# Patient Record
Sex: Male | Born: 1990 | Race: Black or African American | Hispanic: No | Marital: Single | Smoking: Former smoker
Health system: Southern US, Community
[De-identification: ages and names within clinical notes are randomized; demographics above are authoritative.]

## PROBLEM LIST (undated history)

## (undated) DIAGNOSIS — J45909 Unspecified asthma, uncomplicated: Secondary | ICD-10-CM

## (undated) HISTORY — DX: Unspecified asthma, uncomplicated: J45.909

---

## 2014-04-24 ENCOUNTER — Ambulatory Visit (INDEPENDENT_AMBULATORY_CARE_PROVIDER_SITE_OTHER): Payer: Commercial Managed Care - PPO | Admitting: Family Medicine

## 2014-04-24 VITALS — BP 122/70 | HR 97 | Temp 98.2°F | Resp 16 | Ht 74.5 in | Wt 271.8 lb

## 2014-04-24 DIAGNOSIS — J0101 Acute recurrent maxillary sinusitis: Secondary | ICD-10-CM

## 2014-04-24 DIAGNOSIS — J45909 Unspecified asthma, uncomplicated: Secondary | ICD-10-CM

## 2014-04-24 DIAGNOSIS — J4520 Mild intermittent asthma, uncomplicated: Secondary | ICD-10-CM

## 2014-04-24 DIAGNOSIS — J0100 Acute maxillary sinusitis, unspecified: Secondary | ICD-10-CM

## 2014-04-24 MED ORDER — AMOXICILLIN 875 MG PO TABS: 875.0000 mg | ORAL_TABLET | Freq: Two times a day (BID) | ORAL | Status: DC

## 2014-04-24 MED ORDER — PREDNISONE 20 MG PO TABS: 40.0000 mg | ORAL_TABLET | Freq: Every day | ORAL | Status: DC

## 2014-04-24 NOTE — Progress Notes (Signed)
    Patient ID: Andre [name] MRN: [id number], DOB: [birth year], 23 y.o. Date of Encounter: 04/24/2014, 5:19 PM  Primary Physician: No PCP Per Patient  Chief Complaint:  Chief Complaint  Patient presents with  . Cough    x 3 days    HPI: 23 y.o. year old male presents with 5 day history of nasal congestion, post nasal drip, sore throat, sinus pressure, and cough. Afebrile. No chills. Nasal congestion thick and green/yellow. Sinus pressure is the worst symptom. Cough is productive secondary to post nasal drip and not associated with time of day. Ears feel full, leading to sensation of muffled hearing. Has tried OTC cold preps without success. No GI complaints.   No recent antibiotics, recent travels, vomiting, or sick contacts   No leg trauma, sedentary periods, h/o cancer, or tobacco use.  Past Medical History  Diagnosis Date  . Asthma      Home Meds: Prior to Admission medications   Not on File    Allergies:  Allergies  Allergen Reactions  . Ibuprofen Hives    History   Social History  . Marital Status: Single    Spouse Name: N/A    Number of Children: N/A  . Years of Education: N/A   Occupational History  . Not on file.   Social History Main Topics  . Smoking status: Never Smoker   . Smokeless tobacco: Not on file  . Alcohol Use: No  . Drug Use: No  . Sexual Activity: Not on file   Other Topics Concern  . Not on file   Social History Narrative  . No narrative on file     Review of Systems: Constitutional: negative for chills, fever, night sweats or weight changes Cardiovascular: negative for chest pain or palpitations Respiratory: negative for hemoptysis, wheezing, or shortness of breath Abdominal: negative for abdominal pain, nausea, vomiting or diarrhea Dermatological: negative for rash Neurologic: negative for headache   Physical Exam: Blood pressure 122/70, pulse 97, temperature 98.2 F (36.8 C), temperature source Oral, resp. rate 16,  height 6' 2.5" (1.892 m), weight 271 lb 12.8 oz (123.288 kg), SpO2 97.00%., Body mass index is 34.44 kg/(m^2). General: Well developed, well nourished, in no acute distress. Head: Normocephalic, atraumatic, eyes without discharge, sclera non-icteric, nares are congested. Bilateral auditory canals clear, TM's are without perforation, pearly grey with reflective cone of light bilaterally. Serous effusion bilaterally behind TM's. Maxillary sinus TTP. Oral cavity moist, dentition normal. Posterior pharynx with post nasal drip and mild erythema. No peritonsillar abscess or tonsillar exudate. Neck: Supple. No thyromegaly. Full ROM. No lymphadenopathy. Lungs: Clear bilaterally to auscultation without wheezes, rales, or rhonchi. Breathing is unlabored.  Heart: RRR with S1 S2. No murmurs, rubs, or gallops appreciated. Msk:  Strength and tone normal for age. Extremities: No clubbing or cyanosis. No edema. Neuro: Alert and oriented X 3. Moves all extremities spontaneously. CNII-XII grossly in tact. Psych:  Responds to questions appropriately with a normal affect.     ASSESSMENT AND PLAN:  23 y.o. year old male with sinusitis -Recurrent maxillary sinusitis, unspecified chronicity - Plan: amoxicillin (AMOXIL) 875 MG tablet  Asthmatic bronchitis, mild intermittent, uncomplicated - Plan: amoxicillin (AMOXIL) 875 MG tablet, predniSONE (DELTASONE) 20 MG tablet  -Rest/fluids -RTC precautions -RTC 3-5 days if no improvement  Signed, Elvina Sidle, MD 04/24/2014 5:19 PM

## 2014-04-24 NOTE — Patient Instructions (Signed)

## 2015-01-23 ENCOUNTER — Ambulatory Visit (INDEPENDENT_AMBULATORY_CARE_PROVIDER_SITE_OTHER): Payer: 59 | Admitting: Family Medicine

## 2015-01-23 VITALS — BP 128/80 | HR 80 | Temp 98.6°F | Resp 18 | Ht 75.5 in | Wt 263.0 lb

## 2015-01-23 DIAGNOSIS — R21 Rash and other nonspecific skin eruption: Secondary | ICD-10-CM | POA: Diagnosis not present

## 2015-01-23 LAB — POCT CBC
Granulocyte percent: 60.8 %G (ref 37–80)
HCT, POC: 47.1 % (ref 43.5–53.7)
Hemoglobin: 15.3 g/dL (ref 14.1–18.1)
Lymph, poc: 3.3 (ref 0.6–3.4)
MCH, POC: 25.8 pg — AB (ref 27–31.2)
MCHC: 32.5 g/dL (ref 31.8–35.4)
MCV: 79.4 fL — AB (ref 80–97)
MID (CBC): 0.8 (ref 0–0.9)
MPV: 10.8 fL (ref 0–99.8)
PLATELET COUNT, POC: 198 10*3/uL (ref 142–424)
POC Granulocyte: 6.4 (ref 2–6.9)
POC LYMPH PERCENT: 31.6 %L (ref 10–50)
POC MID %: 7.6 %M (ref 0–12)
RBC: 5.93 M/uL (ref 4.69–6.13)
RDW, POC: 15 %
WBC: 10.5 10*3/uL — AB (ref 4.6–10.2)

## 2015-01-23 NOTE — Progress Notes (Signed)
Subjective:    Patient ID: Andre [name], male    DOB: [birth year], 24 y.o.   MRN: [id number]  HPI Patient presents today with bumps on his hands and bump on his tongue for 3 days.  He has 2 daughters (6 months, 1 yo). Older daughter had hand foot mouth disease a couple of weeks ago.  His live in girlfriend of 4 years has had similar lesions. His hands felt a little sore prior to the lesions appearing.  He has a lesion on his tongue. It hurt at first, but now only hurts if he eats something hard. He has been gargling with warm salt water which helps.   He has seen what he thinks are bed bugs in his home. He and his girlfriend have changed all the bedding. He sleeps in boxers and a tee shirt and has not noticed any bites on his arms or legs.   Past Medical History  Diagnosis Date  . Asthma    History reviewed. No pertinent past surgical history. History reviewed. No pertinent family history. History  Substance Use Topics  . Smoking status: Current Every Day Smoker -- 3 years  . Smokeless tobacco: Not on file  . Alcohol Use: No   Medications, allergies, past medical history, surgical history, family history, social history and problem list reviewed and updated.  Review of Systems No fever, no chills, no trunk/leg/foot lesions, no cough, no SOB, no myalgias, no cold symptoms.     Objective:   Physical Exam  Constitutional: He is oriented to person, place, and time. He appears well-developed and well-nourished.  HENT:  Head: Normocephalic and atraumatic.  Mouth/Throat: Uvula is midline. No oropharyngeal exudate, posterior oropharyngeal edema or posterior oropharyngeal erythema.    Small vesicle on right lower gum line.   Neck: Normal range of motion. Neck supple.  Cardiovascular: Normal rate and regular rhythm.   Pulmonary/Chest: Effort normal.  Musculoskeletal: Normal range of motion. He exhibits no edema.  Neurological: He is alert and oriented to person, place, and time.    Skin: Skin is warm and dry. Rash noted.  Bilateral palmar aspect of fingers with multiple red, pinpoint lesions. Few on palms. No lesions on webs, dorsum of hands, arms, trunk, legs.   Psychiatric: He has a normal mood and affect. His behavior is normal. Judgment and thought content normal.  Vitals reviewed.   BP 128/80 mmHg  Pulse 80  Temp(Src) 98.6 F (37 C) (Oral)  Resp 18  Ht 6' 3.5" (1.918 m)  Wt 263 lb (119.296 kg)  BMI 32.43 kg/m2  SpO2 98% Results for orders placed or performed in visit on 01/23/15  POCT CBC  Result Value Ref Range   WBC 10.5 (A) 4.6 - 10.2 K/uL   Lymph, poc 3.3 0.6 - 3.4   POC LYMPH PERCENT 31.6 10 - 50 %L   MID (cbc) 0.8 0 - 0.9   POC MID % 7.6 0 - 12 %M   POC Granulocyte 6.4 2 - 6.9   Granulocyte percent 60.8 37 - 80 %G   RBC 5.93 4.69 - 6.13 M/uL   Hemoglobin 15.3 14.1 - 18.1 g/dL   HCT, POC 08.6 57.8 - 53.7 %   MCV 79.4 (A) 80 - 97 fL   MCH, POC 25.8 (A) 27 - 31.2 pg   MCHC 32.5 31.8 - 35.4 g/dL   RDW, POC 46.9 %   Platelet Count, POC 198 142 - 424 K/uL   MPV 10.8 0 - 99.8 fL  Assessment & Plan:  Discussed with Dr. Katrinka Blazing who also saw the patient  1. Rash and nonspecific skin eruption - POCT CBC- unremarkable - likely viral - Provided written and verbal information regarding diagnosis and treatment. - will RTC if any worsening rash/myalgias/headaches/fever - provided information about bed bugs.    Olean Ree, FNP-BC  [facility], [facility]  01/23/2015 12:18 PM

## 2015-01-23 NOTE — Patient Instructions (Signed)
Your blood count looks good.  This is likely a virus and will resolve on its own.  If it gets worse or if you have nausea/vomiting, fever, severe headache, please come back in to see [place].

## 2015-01-24 ENCOUNTER — Encounter: Payer: Self-pay | Admitting: Family Medicine

## 2015-01-24 NOTE — Progress Notes (Signed)
History and physical examinations obtained with FNP Gessner. Agree with assessment and plan. Kristi Martin Smith, M.D. [facility]  [place] [address] [place], [place]  [address] [phone] phone [phone] fax  

## 2015-12-14 DIAGNOSIS — Z888 Allergy status to other drugs, medicaments and biological substances status: Secondary | ICD-10-CM | POA: Diagnosis not present

## 2015-12-14 DIAGNOSIS — J45901 Unspecified asthma with (acute) exacerbation: Secondary | ICD-10-CM | POA: Diagnosis not present

## 2015-12-14 DIAGNOSIS — Z720 Tobacco use: Secondary | ICD-10-CM | POA: Diagnosis not present

## 2015-12-14 DIAGNOSIS — F17210 Nicotine dependence, cigarettes, uncomplicated: Secondary | ICD-10-CM | POA: Diagnosis not present

## 2015-12-15 DIAGNOSIS — Z888 Allergy status to other drugs, medicaments and biological substances status: Secondary | ICD-10-CM | POA: Diagnosis not present

## 2015-12-15 DIAGNOSIS — F17210 Nicotine dependence, cigarettes, uncomplicated: Secondary | ICD-10-CM | POA: Diagnosis not present

## 2015-12-15 DIAGNOSIS — J45901 Unspecified asthma with (acute) exacerbation: Secondary | ICD-10-CM | POA: Diagnosis not present

## 2016-03-31 ENCOUNTER — Ambulatory Visit (INDEPENDENT_AMBULATORY_CARE_PROVIDER_SITE_OTHER): Payer: 59 | Admitting: Family Medicine

## 2016-03-31 VITALS — BP 120/74 | HR 89 | Temp 98.6°F | Resp 17 | Ht 75.0 in | Wt 254.0 lb

## 2016-03-31 DIAGNOSIS — Z8709 Personal history of other diseases of the respiratory system: Secondary | ICD-10-CM | POA: Diagnosis not present

## 2016-03-31 DIAGNOSIS — H547 Unspecified visual loss: Secondary | ICD-10-CM | POA: Diagnosis not present

## 2016-03-31 DIAGNOSIS — R8290 Unspecified abnormal findings in urine: Secondary | ICD-10-CM

## 2016-03-31 LAB — POC MICROSCOPIC URINALYSIS (UMFC): Mucus: ABSENT

## 2016-03-31 LAB — POCT URINALYSIS DIP (MANUAL ENTRY)
Bilirubin, UA: NEGATIVE
GLUCOSE UA: NEGATIVE
Ketones, POC UA: NEGATIVE
NITRITE UA: NEGATIVE
PH UA: 7.5
Protein Ur, POC: NEGATIVE
Spec Grav, UA: 1.015
UROBILINOGEN UA: 0.2

## 2016-03-31 LAB — GLUCOSE, POCT (MANUAL RESULT ENTRY): POC GLUCOSE: 119 mg/dL — AB (ref 70–99)

## 2016-03-31 NOTE — Progress Notes (Signed)
Patient ID: Andre [name], male    DOB: 07/05/1991  Age: 25 y.o. MRN: [id number]  Chief Complaint  Patient presents with  . Other    hbp, poor vision,   . Asthma    Subjective:   Patient is applying for a job. He was told to get further evaluated because of his history of asthma and because they found proteinuria and glycosuria on his urinalysis. Also he has complaints of poor vision. Has not seen an eye doctor yet. No history of diabetes. He says when he had his asthma attack back last spring he was smoking. Since then he has quit smoking entirely and has not had any problems with his breathing. He is not having history of high blood pressure, though it is noted his blood pressure was high when he came in today. He does not think it was high when he was at the other place getting his pre-job evaluation.  Current allergies, medications, problem list, past/family and social histories reviewed.  Objective:  BP 120/74 (BP Location: Left Arm, Patient Position: Sitting, Cuff Size: Large)   Pulse 89   Temp 98.6 F (37 C) (Oral)   Resp 17   Ht 6\' 3"  (1.905 m)   Wt 254 lb (115.2 kg)   SpO2 99%   PF 730 L/min   BMI 31.75 kg/m   No acute distress. Healthy appearing young man. Repeat blood pressure was good at 120/74. Chest clear. Heart regular without murmur.    Visual Acuity Screening   Right eye Left eye Both eyes  Without correction: 20/40 20/25 20/25   With correction:      Peak flow 730 with her predicted max 682 Assessment & Plan:   Assessment: 1. History of asthma   2. Abnormal urine findings   3. Poor vision       Plan: Check urinalysis. Also check blood sugar. Reviewed his vision screen.   His vision is not great, he might benefit from glasses, but it certainly will not cause problems with most jobs at this level. Toniann FailWendy see an eye doctor.    Results for orders placed or performed in visit on 03/31/16  POCT glucose (manual entry)  Result Value Ref Range   POC  Glucose 119 (A) 70 - 99 mg/dl  POCT Microscopic Urinalysis ([facility])  Result Value Ref Range   WBC,UR,HPF,POC None None WBC/hpf   RBC,UR,HPF,POC None None RBC/hpf   Bacteria Few (A) None, Too numerous to count   Mucus Absent Absent   Epithelial Cells, UR Per Microscopy Few (A) None, Too numerous to count cells/hpf  POCT urinalysis dipstick  Result Value Ref Range   Color, UA yellow yellow   Clarity, UA clear clear   Glucose, UA negative negative   Bilirubin, UA negative negative   Ketones, POC UA negative negative   Spec Grav, UA 1.015    Blood, UA trace-lysed (A) negative   pH, UA 7.5    Protein Ur, POC negative negative   Urobilinogen, UA 0.2    Nitrite, UA Negative Negative   Leukocytes, UA Trace (A) Negative    Orders Placed This Encounter  Procedures  . POCT glucose (manual entry)  . POCT Microscopic Urinalysis ([facility])  . POCT urinalysis dipstick    No orders of the defined types were placed in this encounter.   Letter written for him for his employment.     Patient Instructions   You are cleared to work.  Continue to avoid tobacco use  If you have  problems with your breathing develop please return  Recommend that you see an eye doctor sometime for routine eye examination.      IF you received an x-ray today, you will receive an invoice from [facility]. Please contact [facility] at [phone] with questions or concerns regarding your invoice.   IF you received labwork today, you will receive an invoice from [facility]. Please contact [facility] at [phone] with questions or concerns regarding your invoice.   Our billing staff will not be able to assist you with questions regarding bills from these companies.  You will be contacted with the lab results as soon as they are available. The fastest way to get your results is to activate your My Chart account. Instructions are located on the last page of this  paperwork. If you have not heard from us regarding the results in 2 weeks, please contact this office.         No Follow-up on file.   Lariah Fleer, MD 03/31/2016

## 2016-03-31 NOTE — Patient Instructions (Addendum)
You are cleared to work.  Continue to avoid tobacco use  If you have problems with your breathing develop please return  Recommend that you see an eye doctor sometime for routine eye examination.      IF you received an x-ray today, you will receive an invoice from [facility]. Please contact [facility] at [phone] with questions or concerns regarding your invoice.   IF you received labwork today, you will receive an invoice from [facility]. Please contact [facility] at [phone] with questions or concerns regarding your invoice.   Our billing staff will not be able to assist you with questions regarding bills from these companies.  You will be contacted with the lab results as soon as they are available. The fastest way to get your results is to activate your My Chart account. Instructions are located on the last page of this paperwork. If you have not heard from us regarding the results in 2 weeks, please contact this office.

## 2016-08-23 DIAGNOSIS — J069 Acute upper respiratory infection, unspecified: Secondary | ICD-10-CM | POA: Diagnosis not present

## 2016-08-23 DIAGNOSIS — Z886 Allergy status to analgesic agent status: Secondary | ICD-10-CM | POA: Diagnosis not present

## 2016-08-23 DIAGNOSIS — J45901 Unspecified asthma with (acute) exacerbation: Secondary | ICD-10-CM | POA: Diagnosis not present

## 2016-08-23 DIAGNOSIS — Z720 Tobacco use: Secondary | ICD-10-CM | POA: Diagnosis not present

## 2016-08-23 DIAGNOSIS — R0602 Shortness of breath: Secondary | ICD-10-CM | POA: Diagnosis not present

## 2016-08-23 DIAGNOSIS — B9789 Other viral agents as the cause of diseases classified elsewhere: Secondary | ICD-10-CM | POA: Diagnosis not present

## 2016-08-23 DIAGNOSIS — F17210 Nicotine dependence, cigarettes, uncomplicated: Secondary | ICD-10-CM | POA: Diagnosis not present

## 2016-08-26 DIAGNOSIS — Z7951 Long term (current) use of inhaled steroids: Secondary | ICD-10-CM | POA: Diagnosis not present

## 2016-08-26 DIAGNOSIS — Z79899 Other long term (current) drug therapy: Secondary | ICD-10-CM | POA: Diagnosis not present

## 2016-08-26 DIAGNOSIS — F17210 Nicotine dependence, cigarettes, uncomplicated: Secondary | ICD-10-CM | POA: Diagnosis not present

## 2016-08-26 DIAGNOSIS — J209 Acute bronchitis, unspecified: Secondary | ICD-10-CM | POA: Diagnosis not present

## 2016-08-26 DIAGNOSIS — Z888 Allergy status to other drugs, medicaments and biological substances status: Secondary | ICD-10-CM | POA: Diagnosis not present

## 2016-08-26 DIAGNOSIS — J45909 Unspecified asthma, uncomplicated: Secondary | ICD-10-CM | POA: Diagnosis not present

## 2016-08-26 DIAGNOSIS — Z720 Tobacco use: Secondary | ICD-10-CM | POA: Diagnosis not present

## 2016-10-27 ENCOUNTER — Emergency Department (HOSPITAL_BASED_OUTPATIENT_CLINIC_OR_DEPARTMENT_OTHER)
Admission: EM | Admit: 2016-10-27 | Discharge: 2016-10-27 | Disposition: A | Discharge status: Home / Self Care | Payer: 59 | Attending: Emergency Medicine | Admitting: Emergency Medicine

## 2016-10-27 ENCOUNTER — Encounter (HOSPITAL_BASED_OUTPATIENT_CLINIC_OR_DEPARTMENT_OTHER): Payer: Self-pay | Admitting: Respiratory Therapy

## 2016-10-27 DIAGNOSIS — Z87891 Personal history of nicotine dependence: Secondary | ICD-10-CM | POA: Diagnosis not present

## 2016-10-27 DIAGNOSIS — J45909 Unspecified asthma, uncomplicated: Secondary | ICD-10-CM | POA: Insufficient documentation

## 2016-10-27 DIAGNOSIS — R05 Cough: Secondary | ICD-10-CM | POA: Diagnosis not present

## 2016-10-27 DIAGNOSIS — J111 Influenza due to unidentified influenza virus with other respiratory manifestations: Secondary | ICD-10-CM

## 2016-10-27 DIAGNOSIS — R110 Nausea: Secondary | ICD-10-CM | POA: Diagnosis not present

## 2016-10-27 DIAGNOSIS — R69 Illness, unspecified: Secondary | ICD-10-CM

## 2016-10-27 DIAGNOSIS — R51 Headache: Secondary | ICD-10-CM | POA: Diagnosis not present

## 2016-10-27 MED ORDER — CETIRIZINE HCL 10 MG PO TABS: 10.0000 mg | ORAL_TABLET | Freq: Every day | ORAL | 0 refills | Status: AC

## 2016-10-27 MED ORDER — BENZONATATE 100 MG PO CAPS: 200.0000 mg | ORAL_CAPSULE | Freq: Two times a day (BID) | ORAL | 0 refills | Status: AC | PRN

## 2016-10-27 MED ORDER — ONDANSETRON 4 MG PO TBDP
4.0000 mg | ORAL_TABLET | Freq: Once | ORAL | Status: AC
  Administered 2016-10-27: 4 mg via ORAL
  Filled 2016-10-27: qty 1

## 2016-10-27 MED ORDER — ACETAMINOPHEN 500 MG PO TABS
1000.0000 mg | ORAL_TABLET | Freq: Once | ORAL | Status: AC
  Administered 2016-10-27: 1000 mg via ORAL
  Filled 2016-10-27: qty 2

## 2016-10-27 NOTE — ED Triage Notes (Signed)
c/o cough, HA, body aches-NAD-steady gait

## 2016-10-27 NOTE — ED Provider Notes (Signed)
[facility]-EMERGENCY DEPT Provider Note   CSN: [id number] Arrival date & time: 10/27/16 1108     History    Chief Complaint  Patient presents with  . Cough     HPI Andre [name] is a 26 y.o. male.  26yo M who p/w Cough, body aches, and headache. Yesterday, the patient began developing a cough associated with body aches and headache. He felt hot last night but did not measure his temperature. He has also had chills. He tried to go to work this morning but had to leave because he felt bad. He reports mild nausea but no vomiting, diarrhea, congestion, or sore throat. He reports that his friend who he has been spending time with has been sick recently with similar symptoms. He took 2 medications from his friend last night, cetirizine and a clear gelcap Cough medication which seemed to improve his sx. he has been able to drink well and denies any urinary symptoms. No breathing problems. No rash.   Past Medical History:  Diagnosis Date  . Asthma      There are no active problems to display for this patient.   History reviewed. No pertinent surgical history.      Home Medications    Prior to Admission medications   Not on File      No family history on file.   Social History  Substance Use Topics  . Smoking status: Former Games developer  . Smokeless tobacco: Never Used  . Alcohol use No     Allergies     Ibuprofen    Review of Systems  10 Systems reviewed and are negative for acute change except as noted in the HPI.   Physical Exam Updated Vital Signs BP 140/88 (BP Location: Left Arm)   Pulse 90   Temp 99.8 F (37.7 C) (Oral)   Resp 18   Ht 6\' 4"  (1.93 m)   Wt 257 lb (116.6 kg)   SpO2 99%   BMI 31.28 kg/m   Physical Exam  Constitutional: He is oriented to person, place, and time. He appears well-developed and well-nourished. No distress.  HENT:  Head: Normocephalic and atraumatic.  Mild erythema of posterior oropharynx w/ no tonsillar edema, uvula  midline, Moist mucous membranes  Eyes: Conjunctivae are normal. Pupils are equal, round, and reactive to light.  Neck: Normal range of motion. Neck supple.  Cardiovascular: Normal rate, regular rhythm and normal heart sounds.   No murmur heard. Pulmonary/Chest: Effort normal and breath sounds normal. He has no wheezes.  Abdominal: Soft. Bowel sounds are normal. He exhibits no distension. There is no tenderness.  Musculoskeletal: He exhibits no edema.  Lymphadenopathy:    He has no cervical adenopathy.  Neurological: He is alert and oriented to person, place, and time.  Fluent speech  Skin: Skin is warm and dry.  Psychiatric: He has a normal mood and affect. Judgment normal.  Nursing note and vitals reviewed.     ED Treatments / Results  Labs (all labs ordered are listed, but only abnormal results are displayed) Labs Reviewed - No data to display   EKG  EKG Interpretation  Date/Time:    Ventricular Rate:    PR Interval:    QRS Duration:   QT Interval:    QTC Calculation:   R Axis:     Text Interpretation:           Radiology No results found.  Procedures Procedures (including critical care time) Procedures  Medications Ordered in ED  Medications  acetaminophen (TYLENOL) tablet 1,000 mg (1,000 mg Oral Given 10/27/16 1150)  ondansetron (ZOFRAN-ODT) disintegrating tablet 4 mg (4 mg Oral Given 10/27/16 1149)     Initial Impression / Assessment and Plan / ED Course  I have reviewed the triage vital signs and the nursing notes.       Pt w/ 1 day of influenza-like symptoms including cough, body aches, headaches, and mild nausea. He was awake and alert, comfortable on exam, normal vital signs and normal O2 saturation on room air. No wheezing or respiratory complaints. Neck supple with no signs or symptoms of meningitis. His symptoms are consistent with a viral process such as influenza. Given his well appearance, overall health I explained the risks and benefits  of Tamiflu but ultimately felt that Tamiflu would not offer much benefit. He denies any respiratory complaints. Gave the patient Tylenol and Zofran and emphasized the importance of hydration. Reviewed return precautions including any worsening symptoms or breathing problems. Patient voiced understanding and was discharged in satisfactory condition.  Final Clinical Impressions(s) / ED Diagnoses   Final diagnoses:  Influenza-like illness     New Prescriptions   No medications on file       Laurence Spatesachel Morgan Dorwin Fitzhenry, MD 10/27/16 1208

## 2016-10-27 NOTE — ED Notes (Signed)
ED Provider at bedside. 

## 2017-05-27 DIAGNOSIS — Z87891 Personal history of nicotine dependence: Secondary | ICD-10-CM | POA: Diagnosis not present

## 2017-05-27 DIAGNOSIS — R1030 Lower abdominal pain, unspecified: Secondary | ICD-10-CM | POA: Diagnosis not present

## 2017-05-27 DIAGNOSIS — A6001 Herpesviral infection of penis: Secondary | ICD-10-CM | POA: Diagnosis not present

## 2017-05-27 DIAGNOSIS — Z886 Allergy status to analgesic agent status: Secondary | ICD-10-CM | POA: Diagnosis not present

## 2017-05-27 DIAGNOSIS — B009 Herpesviral infection, unspecified: Secondary | ICD-10-CM | POA: Diagnosis not present

## 2017-11-14 ENCOUNTER — Emergency Department (HOSPITAL_COMMUNITY): Payer: Managed Care, Other (non HMO)

## 2017-11-14 ENCOUNTER — Inpatient Hospital Stay (HOSPITAL_COMMUNITY): Payer: Managed Care, Other (non HMO)

## 2017-11-14 ENCOUNTER — Inpatient Hospital Stay (HOSPITAL_COMMUNITY)
Admission: EM | Admit: 2017-11-14 | Discharge: 2017-11-17 | DRG: 964 | Disposition: A | Discharge status: Home Health | Payer: Managed Care, Other (non HMO) | Attending: General Surgery | Admitting: General Surgery

## 2017-11-14 ENCOUNTER — Encounter (HOSPITAL_COMMUNITY): Payer: Self-pay | Admitting: Emergency Medicine

## 2017-11-14 ENCOUNTER — Emergency Department (HOSPITAL_COMMUNITY): Payer: Managed Care, Other (non HMO) | Admitting: Certified Registered"

## 2017-11-14 ENCOUNTER — Encounter (HOSPITAL_COMMUNITY): Admission: EM | Disposition: A | Discharge status: Home Health | Payer: Self-pay | Source: Home / Self Care

## 2017-11-14 ENCOUNTER — Other Ambulatory Visit: Payer: Self-pay

## 2017-11-14 DIAGNOSIS — Y92410 Unspecified street and highway as the place of occurrence of the external cause: Secondary | ICD-10-CM

## 2017-11-14 DIAGNOSIS — S73004A Unspecified dislocation of right hip, initial encounter: Secondary | ICD-10-CM | POA: Diagnosis present

## 2017-11-14 DIAGNOSIS — S022XXB Fracture of nasal bones, initial encounter for open fracture: Secondary | ICD-10-CM

## 2017-11-14 DIAGNOSIS — S72061A Displaced articular fracture of head of right femur, initial encounter for closed fracture: Secondary | ICD-10-CM

## 2017-11-14 DIAGNOSIS — S72051A Unspecified fracture of head of right femur, initial encounter for closed fracture: Principal | ICD-10-CM | POA: Diagnosis present

## 2017-11-14 DIAGNOSIS — S062X0A Diffuse traumatic brain injury without loss of consciousness, initial encounter: Secondary | ICD-10-CM | POA: Diagnosis present

## 2017-11-14 DIAGNOSIS — S27329A Contusion of lung, unspecified, initial encounter: Secondary | ICD-10-CM

## 2017-11-14 DIAGNOSIS — J45909 Unspecified asthma, uncomplicated: Secondary | ICD-10-CM | POA: Diagnosis present

## 2017-11-14 DIAGNOSIS — S01511A Laceration without foreign body of lip, initial encounter: Secondary | ICD-10-CM | POA: Diagnosis present

## 2017-11-14 DIAGNOSIS — F10129 Alcohol abuse with intoxication, unspecified: Secondary | ICD-10-CM | POA: Diagnosis present

## 2017-11-14 DIAGNOSIS — S27322A Contusion of lung, bilateral, initial encounter: Secondary | ICD-10-CM | POA: Diagnosis present

## 2017-11-14 DIAGNOSIS — E876 Hypokalemia: Secondary | ICD-10-CM | POA: Diagnosis present

## 2017-11-14 DIAGNOSIS — S72001A Fracture of unspecified part of neck of right femur, initial encounter for closed fracture: Secondary | ICD-10-CM

## 2017-11-14 DIAGNOSIS — Y903 Blood alcohol level of 60-79 mg/100 ml: Secondary | ICD-10-CM | POA: Diagnosis present

## 2017-11-14 DIAGNOSIS — S06350A Traumatic hemorrhage of left cerebrum without loss of consciousness, initial encounter: Secondary | ICD-10-CM

## 2017-11-14 DIAGNOSIS — S060X0A Concussion without loss of consciousness, initial encounter: Secondary | ICD-10-CM | POA: Diagnosis present

## 2017-11-14 DIAGNOSIS — Z6836 Body mass index (BMI) 36.0-36.9, adult: Secondary | ICD-10-CM | POA: Diagnosis not present

## 2017-11-14 DIAGNOSIS — S0990XA Unspecified injury of head, initial encounter: Secondary | ICD-10-CM

## 2017-11-14 DIAGNOSIS — S0121XA Laceration without foreign body of nose, initial encounter: Secondary | ICD-10-CM | POA: Diagnosis present

## 2017-11-14 DIAGNOSIS — S06340A Traumatic hemorrhage of right cerebrum without loss of consciousness, initial encounter: Secondary | ICD-10-CM

## 2017-11-14 DIAGNOSIS — R0989 Other specified symptoms and signs involving the circulatory and respiratory systems: Secondary | ICD-10-CM

## 2017-11-14 HISTORY — PX: CLOSED REDUCTION NASAL FRACTURE: SHX5365

## 2017-11-14 HISTORY — PX: HIP CLOSED REDUCTION: SHX983

## 2017-11-14 LAB — I-STAT CG4 LACTIC ACID, ED: LACTIC ACID, VENOUS: 4.71 mmol/L — AB (ref 0.5–1.9)

## 2017-11-14 LAB — CBC
HEMATOCRIT: 42.7 % (ref 39.0–52.0)
HEMOGLOBIN: 15.6 g/dL (ref 13.0–17.0)
MCH: 27 pg (ref 26.0–34.0)
MCHC: 36.5 g/dL — ABNORMAL HIGH (ref 30.0–36.0)
MCV: 73.9 fL — AB (ref 78.0–100.0)
Platelets: 211 10*3/uL (ref 150–400)
RBC: 5.78 MIL/uL (ref 4.22–5.81)
RDW: 15.6 % — AB (ref 11.5–15.5)
WBC: 17.6 10*3/uL — ABNORMAL HIGH (ref 4.0–10.5)

## 2017-11-14 LAB — I-STAT CHEM 8, ED
BUN: 7 mg/dL (ref 6–20)
CREATININE: 1.2 mg/dL (ref 0.61–1.24)
Calcium, Ion: 1.06 mmol/L — ABNORMAL LOW (ref 1.15–1.40)
Chloride: 103 mmol/L (ref 101–111)
Glucose, Bld: 121 mg/dL — ABNORMAL HIGH (ref 65–99)
HEMATOCRIT: 43 % (ref 39.0–52.0)
HEMOGLOBIN: 14.6 g/dL (ref 13.0–17.0)
Potassium: 2.5 mmol/L — CL (ref 3.5–5.1)
Sodium: 140 mmol/L (ref 135–145)
TCO2: 20 mmol/L — AB (ref 22–32)

## 2017-11-14 LAB — COMPREHENSIVE METABOLIC PANEL
ALT: 18 U/L (ref 17–63)
AST: 32 U/L (ref 15–41)
Albumin: 4.1 g/dL (ref 3.5–5.0)
Alkaline Phosphatase: 61 U/L (ref 38–126)
Anion gap: 15 (ref 5–15)
BUN: 6 mg/dL (ref 6–20)
CHLORIDE: 103 mmol/L (ref 101–111)
CO2: 20 mmol/L — ABNORMAL LOW (ref 22–32)
Calcium: 8.8 mg/dL — ABNORMAL LOW (ref 8.9–10.3)
Creatinine, Ser: 1.12 mg/dL (ref 0.61–1.24)
GFR calc Af Amer: >60 mL/min (ref >60)
GFR calc non Af Amer: >60 mL/min (ref >60)
Glucose, Bld: 125 mg/dL — ABNORMAL HIGH (ref 65–99)
POTASSIUM: 2.8 mmol/L — AB (ref 3.5–5.1)
Sodium: 138 mmol/L (ref 135–145)
Total Bilirubin: 1.8 mg/dL — ABNORMAL HIGH (ref 0.3–1.2)
Total Protein: 7.1 g/dL (ref 6.5–8.1)

## 2017-11-14 LAB — URINALYSIS, ROUTINE W REFLEX MICROSCOPIC
BACTERIA UA: NONE SEEN
BILIRUBIN URINE: NEGATIVE
Glucose, UA: NEGATIVE mg/dL
KETONES UR: NEGATIVE mg/dL
LEUKOCYTES UA: NEGATIVE
NITRITE: NEGATIVE
Protein, ur: NEGATIVE mg/dL
Specific Gravity, Urine: 1.019 (ref 1.005–1.030)
Squamous Epithelial / LPF: NONE SEEN
pH: 6 (ref 5.0–8.0)

## 2017-11-14 LAB — PROTIME-INR
INR: 1.14
Prothrombin Time: 14.5 seconds (ref 11.4–15.2)

## 2017-11-14 LAB — RAPID URINE DRUG SCREEN, HOSP PERFORMED
AMPHETAMINES: NOT DETECTED
BARBITURATES: NOT DETECTED
BENZODIAZEPINES: NOT DETECTED
COCAINE: NOT DETECTED
Opiates: NOT DETECTED
Tetrahydrocannabinol: POSITIVE — AB

## 2017-11-14 LAB — LACTIC ACID, PLASMA: LACTIC ACID, VENOUS: 2 mmol/L — AB (ref 0.5–1.9)

## 2017-11-14 LAB — MAGNESIUM: MAGNESIUM: 2.1 mg/dL (ref 1.7–2.4)

## 2017-11-14 LAB — SAMPLE TO BLOOD BANK

## 2017-11-14 LAB — ETHANOL: Alcohol, Ethyl (B): 63 mg/dL — ABNORMAL HIGH (ref <10)

## 2017-11-14 SURGERY — CLOSED REDUCTION, HIP
Anesthesia: Monitor Anesthesia Care | Site: Nose | Laterality: Right

## 2017-11-14 MED ORDER — PROPOFOL 10 MG/ML IV BOLUS
INTRAVENOUS | Status: AC | PRN
  Administered 2017-11-14 (×3): 5 mg via INTRAVENOUS

## 2017-11-14 MED ORDER — FENTANYL CITRATE (PF) 100 MCG/2ML IJ SOLN
50.0000 ug | INTRAMUSCULAR | Status: DC | PRN
  Administered 2017-11-14: 50 ug via INTRAVENOUS
  Administered 2017-11-14 (×2): 100 ug via INTRAVENOUS
  Administered 2017-11-14 (×2): 50 ug via INTRAVENOUS
  Filled 2017-11-14 (×2): qty 2

## 2017-11-14 MED ORDER — OXYCODONE HCL 5 MG PO TABS
10.0000 mg | ORAL_TABLET | ORAL | Status: DC | PRN
  Administered 2017-11-14 – 2017-11-16 (×5): 10 mg via ORAL
  Filled 2017-11-14 (×6): qty 2

## 2017-11-14 MED ORDER — ALBUTEROL SULFATE (2.5 MG/3ML) 0.083% IN NEBU
INHALATION_SOLUTION | RESPIRATORY_TRACT | Status: AC
  Administered 2017-11-14: 2.5 mg via RESPIRATORY_TRACT
  Filled 2017-11-14: qty 3

## 2017-11-14 MED ORDER — HALOPERIDOL LACTATE 5 MG/ML IJ SOLN
5.0000 mg | Freq: Once | INTRAMUSCULAR | Status: AC
  Administered 2017-11-14: 5 mg via INTRAVENOUS

## 2017-11-14 MED ORDER — LIDOCAINE HCL (CARDIAC) 20 MG/ML IV SOLN
INTRAVENOUS | Status: DC | PRN
  Administered 2017-11-14: 100 mg via INTRAVENOUS

## 2017-11-14 MED ORDER — ALBUTEROL SULFATE (2.5 MG/3ML) 0.083% IN NEBU
2.5000 mg | INHALATION_SOLUTION | Freq: Four times a day (QID) | RESPIRATORY_TRACT | Status: DC | PRN
  Administered 2017-11-14: 2.5 mg via RESPIRATORY_TRACT

## 2017-11-14 MED ORDER — SUCCINYLCHOLINE 20MG/ML (10ML) SYRINGE FOR MEDFUSION PUMP - OPTIME
INTRAMUSCULAR | Status: DC | PRN
  Administered 2017-11-14: 120 mg via INTRAVENOUS

## 2017-11-14 MED ORDER — PROCHLORPERAZINE EDISYLATE 5 MG/ML IJ SOLN
5.0000 mg | Freq: Four times a day (QID) | INTRAMUSCULAR | Status: DC | PRN
  Filled 2017-11-14: qty 2

## 2017-11-14 MED ORDER — 0.9 % SODIUM CHLORIDE (POUR BTL) OPTIME
TOPICAL | Status: DC | PRN
  Administered 2017-11-14: 1000 mL

## 2017-11-14 MED ORDER — DEXAMETHASONE SODIUM PHOSPHATE 10 MG/ML IJ SOLN
INTRAMUSCULAR | Status: DC | PRN
  Administered 2017-11-14: 10 mg via INTRAVENOUS

## 2017-11-14 MED ORDER — KCL IN DEXTROSE-NACL 20-5-0.45 MEQ/L-%-% IV SOLN
INTRAVENOUS | Status: DC
  Administered 2017-11-14 (×2): via INTRAVENOUS
  Filled 2017-11-14 (×3): qty 1000

## 2017-11-14 MED ORDER — SODIUM CHLORIDE 0.9 % IV SOLN
INTRAVENOUS | Status: DC
  Administered 2017-11-14: 04:00:00 via INTRAVENOUS

## 2017-11-14 MED ORDER — LORAZEPAM 2 MG/ML IJ SOLN
1.0000 mg | Freq: Once | INTRAMUSCULAR | Status: AC
  Administered 2017-11-14: 1 mg via INTRAVENOUS

## 2017-11-14 MED ORDER — LIDOCAINE-EPINEPHRINE 1 %-1:100000 IJ SOLN
INTRAMUSCULAR | Status: DC | PRN
  Administered 2017-11-14: 7 mL

## 2017-11-14 MED ORDER — MIDAZOLAM HCL 2 MG/2ML IJ SOLN
INTRAMUSCULAR | Status: AC
  Filled 2017-11-14: qty 2

## 2017-11-14 MED ORDER — HYDROMORPHONE HCL 1 MG/ML IJ SOLN: 1.0000 mg | INTRAMUSCULAR | Status: DC | PRN

## 2017-11-14 MED ORDER — PROCHLORPERAZINE MALEATE 10 MG PO TABS
10.0000 mg | ORAL_TABLET | Freq: Four times a day (QID) | ORAL | Status: DC | PRN
  Filled 2017-11-14: qty 1

## 2017-11-14 MED ORDER — ONDANSETRON HCL 4 MG/2ML IJ SOLN
4.0000 mg | Freq: Once | INTRAMUSCULAR | Status: AC
  Administered 2017-11-14: 4 mg via INTRAVENOUS

## 2017-11-14 MED ORDER — PROPOFOL 10 MG/ML IV BOLUS
INTRAVENOUS | Status: AC | PRN
  Administered 2017-11-14: 10 mg via INTRAVENOUS

## 2017-11-14 MED ORDER — LACTATED RINGERS IV SOLN
INTRAVENOUS | Status: DC | PRN
  Administered 2017-11-14: 07:00:00 via INTRAVENOUS

## 2017-11-14 MED ORDER — LIDOCAINE-EPINEPHRINE 1 %-1:100000 IJ SOLN
INTRAMUSCULAR | Status: AC
  Filled 2017-11-14: qty 1

## 2017-11-14 MED ORDER — PROMETHAZINE HCL 25 MG/ML IJ SOLN: 6.2500 mg | INTRAMUSCULAR | Status: DC | PRN

## 2017-11-14 MED ORDER — SODIUM CHLORIDE 0.9 % IV BOLUS
1000.0000 mL | Freq: Once | INTRAVENOUS | Status: AC
  Administered 2017-11-14: 1000 mL via INTRAVENOUS

## 2017-11-14 MED ORDER — DOUBLE ANTIBIOTIC 500-10000 UNIT/GM EX OINT
TOPICAL_OINTMENT | CUTANEOUS | Status: AC
  Filled 2017-11-14: qty 1

## 2017-11-14 MED ORDER — PROPOFOL 10 MG/ML IV BOLUS
INTRAVENOUS | Status: AC
  Filled 2017-11-14: qty 20

## 2017-11-14 MED ORDER — TETANUS-DIPHTH-ACELL PERTUSSIS 5-2.5-18.5 LF-MCG/0.5 IM SUSP
INTRAMUSCULAR | Status: AC
  Filled 2017-11-14: qty 0.5

## 2017-11-14 MED ORDER — ONDANSETRON HCL 4 MG/2ML IJ SOLN: 4.0000 mg | Freq: Four times a day (QID) | INTRAMUSCULAR | Status: DC | PRN

## 2017-11-14 MED ORDER — CEFAZOLIN SODIUM-DEXTROSE 1-4 GM/50ML-% IV SOLN
1.0000 g | Freq: Three times a day (TID) | INTRAVENOUS | Status: AC
  Administered 2017-11-14 – 2017-11-15 (×3): 1 g via INTRAVENOUS
  Filled 2017-11-14 (×3): qty 50

## 2017-11-14 MED ORDER — ONDANSETRON HCL 4 MG/2ML IJ SOLN
INTRAMUSCULAR | Status: AC
  Filled 2017-11-14: qty 2

## 2017-11-14 MED ORDER — FENTANYL CITRATE (PF) 100 MCG/2ML IJ SOLN
100.0000 ug | Freq: Once | INTRAMUSCULAR | Status: AC
  Administered 2017-11-14: 100 ug via INTRAVENOUS

## 2017-11-14 MED ORDER — SUGAMMADEX SODIUM 200 MG/2ML IV SOLN
INTRAVENOUS | Status: DC | PRN
  Administered 2017-11-14: 272.2 mg via INTRAVENOUS

## 2017-11-14 MED ORDER — CEFAZOLIN SODIUM-DEXTROSE 2-4 GM/100ML-% IV SOLN
2.0000 g | Freq: Once | INTRAVENOUS | Status: AC
  Administered 2017-11-14: 3 g via INTRAVENOUS
  Filled 2017-11-14: qty 100

## 2017-11-14 MED ORDER — MIDAZOLAM HCL 5 MG/5ML IJ SOLN
INTRAMUSCULAR | Status: DC | PRN
  Administered 2017-11-14: 1 mg via INTRAVENOUS

## 2017-11-14 MED ORDER — ONDANSETRON 4 MG PO TBDP
4.0000 mg | ORAL_TABLET | Freq: Four times a day (QID) | ORAL | Status: DC | PRN
  Filled 2017-11-14: qty 1

## 2017-11-14 MED ORDER — IOPAMIDOL (ISOVUE-300) INJECTION 61%
100.0000 mL | Freq: Once | INTRAVENOUS | Status: AC | PRN
  Administered 2017-11-14: 100 mL via INTRAVENOUS

## 2017-11-14 MED ORDER — IOPAMIDOL (ISOVUE-300) INJECTION 61%
INTRAVENOUS | Status: AC
  Filled 2017-11-14: qty 100

## 2017-11-14 MED ORDER — HYDROMORPHONE HCL 1 MG/ML IJ SOLN: 0.2500 mg | INTRAMUSCULAR | Status: DC | PRN

## 2017-11-14 MED ORDER — PANTOPRAZOLE SODIUM 40 MG IV SOLR
40.0000 mg | Freq: Every day | INTRAVENOUS | Status: DC
  Administered 2017-11-14: 40 mg via INTRAVENOUS
  Filled 2017-11-14: qty 40

## 2017-11-14 MED ORDER — ONDANSETRON HCL 4 MG/2ML IJ SOLN
4.0000 mg | Freq: Four times a day (QID) | INTRAMUSCULAR | Status: DC | PRN
  Administered 2017-11-14 (×2): 4 mg via INTRAVENOUS
  Filled 2017-11-14: qty 2

## 2017-11-14 MED ORDER — ROCURONIUM BROMIDE 100 MG/10ML IV SOLN
INTRAVENOUS | Status: DC | PRN
  Administered 2017-11-14: 30 mg via INTRAVENOUS

## 2017-11-14 MED ORDER — ALBUMIN HUMAN 5 % IV SOLN
INTRAVENOUS | Status: DC | PRN
  Administered 2017-11-14: 08:00:00 via INTRAVENOUS

## 2017-11-14 MED ORDER — FENTANYL CITRATE (PF) 100 MCG/2ML IJ SOLN
INTRAMUSCULAR | Status: AC
  Administered 2017-11-14: 50 ug via INTRAVENOUS
  Filled 2017-11-14: qty 2

## 2017-11-14 MED ORDER — PHENYLEPHRINE 40 MCG/ML (10ML) SYRINGE FOR IV PUSH (FOR BLOOD PRESSURE SUPPORT)
PREFILLED_SYRINGE | INTRAVENOUS | Status: AC
  Filled 2017-11-14: qty 10

## 2017-11-14 MED ORDER — POTASSIUM CHLORIDE 10 MEQ/100ML IV SOLN
10.0000 meq | INTRAVENOUS | Status: AC
  Administered 2017-11-14 (×2): 10 meq via INTRAVENOUS
  Filled 2017-11-14 (×2): qty 100

## 2017-11-14 MED ORDER — PROPOFOL 10 MG/ML IV BOLUS
0.5000 mg/kg | Freq: Once | INTRAVENOUS | Status: DC
  Filled 2017-11-14: qty 20

## 2017-11-14 MED ORDER — HALOPERIDOL LACTATE 5 MG/ML IJ SOLN
INTRAMUSCULAR | Status: AC
  Filled 2017-11-14: qty 1

## 2017-11-14 MED ORDER — PROPOFOL 10 MG/ML IV BOLUS
1.0000 mg/kg | Freq: Once | INTRAVENOUS | Status: AC
  Administered 2017-11-14: 150 mg via INTRAVENOUS

## 2017-11-14 MED ORDER — TETANUS-DIPHTH-ACELL PERTUSSIS 5-2.5-18.5 LF-MCG/0.5 IM SUSP
0.5000 mL | Freq: Once | INTRAMUSCULAR | Status: AC
  Administered 2017-11-14: 0.5 mL via INTRAMUSCULAR

## 2017-11-14 MED ORDER — PANTOPRAZOLE SODIUM 40 MG PO TBEC
40.0000 mg | DELAYED_RELEASE_TABLET | Freq: Every day | ORAL | Status: DC
  Administered 2017-11-15 – 2017-11-17 (×3): 40 mg via ORAL
  Filled 2017-11-14 (×3): qty 1

## 2017-11-14 MED ORDER — LORAZEPAM 2 MG/ML IJ SOLN
INTRAMUSCULAR | Status: AC
  Filled 2017-11-14: qty 1

## 2017-11-14 MED ORDER — FENTANYL CITRATE (PF) 250 MCG/5ML IJ SOLN
INTRAMUSCULAR | Status: AC
Start: 2017-11-14 — End: ?
  Filled 2017-11-14: qty 5

## 2017-11-14 SURGICAL SUPPLY — 50 items
BIT DRILL 4.8MMDIAX5IN DISPOSE (BIT) IMPLANT
CUFF TOURNIQUET SINGLE 34IN LL (TOURNIQUET CUFF) IMPLANT
CUFF TOURNIQUET SINGLE 44IN (TOURNIQUET CUFF) IMPLANT
DRAPE IMP U-DRAPE 54X76 (DRAPES) IMPLANT
DRAPE STERI IOBAN 125X83 (DRAPES) IMPLANT
DRAPE SURG 17X23 STRL (DRAPES) IMPLANT
DRESSING NASAL POPE 10X1.5X2.5 (GAUZE/BANDAGES/DRESSINGS) ×2 IMPLANT
DRILL BIT 4.8MMDIAX5IN DISPOSE (BIT)
DRSG ADAPTIC 3X8 NADH LF (GAUZE/BANDAGES/DRESSINGS) IMPLANT
DRSG NASAL POPE 10X1.5X2.5 (GAUZE/BANDAGES/DRESSINGS) ×4
DRSG PAD ABDOMINAL 8X10 ST (GAUZE/BANDAGES/DRESSINGS) IMPLANT
DURAPREP 26ML APPLICATOR (WOUND CARE) IMPLANT
ELECT CAUTERY BLADE 6.4 (BLADE) IMPLANT
ELECT REM PT RETURN 9FT ADLT (ELECTROSURGICAL) ×4
ELECTRODE REM PT RTRN 9FT ADLT (ELECTROSURGICAL) ×2 IMPLANT
EVACUATOR 1/8 PVC DRAIN (DRAIN) IMPLANT
GAUZE SPONGE 4X4 12PLY STRL (GAUZE/BANDAGES/DRESSINGS) IMPLANT
GLOVE BIO SURGEON STRL SZ8 (GLOVE) ×4 IMPLANT
GLOVE BIOGEL PI IND STRL 7.5 (GLOVE) ×2 IMPLANT
GLOVE BIOGEL PI IND STRL 8 (GLOVE) ×2 IMPLANT
GLOVE BIOGEL PI INDICATOR 7.5 (GLOVE) ×2
GLOVE BIOGEL PI INDICATOR 8 (GLOVE) ×2
GLOVE ORTHO TXT STRL SZ7.5 (GLOVE) ×4 IMPLANT
GLOVE SURG ORTHO 8.0 STRL STRW (GLOVE) ×4 IMPLANT
GOWN STRL REUS W/ TWL LRG LVL3 (GOWN DISPOSABLE) ×2 IMPLANT
GOWN STRL REUS W/ TWL XL LVL3 (GOWN DISPOSABLE) ×6 IMPLANT
GOWN STRL REUS W/TWL LRG LVL3 (GOWN DISPOSABLE) ×2
GOWN STRL REUS W/TWL XL LVL3 (GOWN DISPOSABLE) ×6
IMMOBILIZER KNEE 22 (SOFTGOODS) IMPLANT
KIT BASIN OR (CUSTOM PROCEDURE TRAY) ×4 IMPLANT
KIT SPLINT NASAL DENVER SM BEI (GAUZE/BANDAGES/DRESSINGS) ×4 IMPLANT
KIT TURNOVER KIT B (KITS) ×4 IMPLANT
MANIFOLD NEPTUNE II (INSTRUMENTS) IMPLANT
NS IRRIG 1000ML POUR BTL (IV SOLUTION) ×4 IMPLANT
PACK GENERAL/GYN (CUSTOM PROCEDURE TRAY) ×4 IMPLANT
PACK UNIVERSAL I (CUSTOM PROCEDURE TRAY) IMPLANT
PAD ARMBOARD 7.5X6 YLW CONV (MISCELLANEOUS) ×8 IMPLANT
STAPLER VISISTAT 35W (STAPLE) ×4 IMPLANT
SUT CHROMIC 4 0 P 3 18 (SUTURE) ×8 IMPLANT
SUT MON AB 5-0 P3 18 (SUTURE) ×4 IMPLANT
SUT PLAIN 5 0 P 3 18 (SUTURE) ×4 IMPLANT
SUT VIC AB 0 CT1 27 (SUTURE)
SUT VIC AB 0 CT1 27XBRD ANBCTR (SUTURE) IMPLANT
SUT VIC AB 1 CT1 27 (SUTURE)
SUT VIC AB 1 CT1 27XBRD ANBCTR (SUTURE) IMPLANT
SUT VIC AB 2-0 CT1 27 (SUTURE)
SUT VIC AB 2-0 CT1 TAPERPNT 27 (SUTURE) IMPLANT
TOWEL OR 17X24 6PK STRL BLUE (TOWEL DISPOSABLE) ×4 IMPLANT
TOWEL OR 17X26 10 PK STRL BLUE (TOWEL DISPOSABLE) ×4 IMPLANT
WATER STERILE IRR 1000ML POUR (IV SOLUTION) ×4 IMPLANT

## 2017-11-14 NOTE — Transfer of Care (Signed)
Immediate Anesthesia Transfer of Care Note  Patient: Andre McclintockJaquan Khalsa  Procedure(s) Performed: CLOSED REDUCTION HIP (Right Hip) CLOSED REDUCTION NASAL FRACTURE AND CLOSURE OF NASAL LACERATIONS (N/A Nose)  Patient Location: PACU  Anesthesia Type:General  Level of Consciousness: sedated and responds to stimulation  Airway & Oxygen Therapy: Patient connected to face mask oxygen  Post-op Assessment: Post -op Vital signs reviewed and stable  Post vital signs: stable  Last Vitals:  Vitals Value Taken Time  BP 153/60 11/14/2017  8:58 AM  Temp    Pulse 91 11/14/2017  9:02 AM  Resp 18 11/14/2017  9:02 AM  SpO2 95 % 11/14/2017  9:02 AM  Vitals shown include unvalidated device data.  Last Pain:  Vitals:   11/14/17 0716  TempSrc:   PainSc: 10-Worst pain ever         Complications: No apparent anesthesia complications
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
## 2017-11-14 NOTE — H&P (Signed)
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 dislocation with femoral head fx B/l pulm contusions Nasal bone fx with overlying nasal laceration L intraparenchymal brain contusion Hypokalemia  To or with ortho for hip/femur - Dr Alyssa Grove NSG consult pending - Dr Ronnald Ramp ENT/facial trauma for nose/lacerations - ED to call ICU after OR Repeat labs this afternoon after OR scds only given head bleed  Leighton Ruff. Redmond Pulling, MD, FACS General, Bariatric, & Minimally Invasive Surgery [facility]   Greer Pickerel 11/14/2017, 6:32 AM   Procedures

## 2017-11-14 NOTE — Anesthesia Postprocedure Evaluation (Signed)
Anesthesia Post Note  Patient: Andre [name]  Procedure(s) Performed: CLOSED REDUCTION HIP (Right Hip) CLOSED REDUCTION NASAL FRACTURE AND CLOSURE OF NASAL LACERATIONS (N/A Nose)     Patient location during evaluation: PACU Anesthesia Type: General Level of consciousness: sedated Pain management: pain level controlled Vital Signs Assessment: post-procedure vital signs reviewed and stable Respiratory status: spontaneous breathing and respiratory function stable Cardiovascular status: stable Postop Assessment: no apparent nausea or vomiting Anesthetic complications: no    Last Vitals:  Vitals:   11/14/17 1045 11/14/17 1100  BP: (!) 161/89 (!) 176/88  Pulse: 69 69  Resp: 12 15  Temp:  (!) 36.4 C  SpO2: 94% 97%    Last Pain:  Vitals:   11/14/17 0716  TempSrc:   PainSc: 10-Worst pain ever                 Tyisha Cressy DANIEL

## 2017-11-14 NOTE — Anesthesia Procedure Notes (Signed)
Procedure Name: Intubation Date/Time: 11/14/2017 7:46 AM Performed by: Lavell Luster, CRNA Pre-anesthesia Checklist: Patient identified, Emergency Drugs available, Suction available, Patient being monitored and Timeout performed Patient Re-evaluated:Patient Re-evaluated prior to induction Oxygen Delivery Method: Circle system utilized Preoxygenation: Pre-oxygenation with 100% oxygen Induction Type: IV induction and Rapid sequence Laryngoscope Size: Mac, 4 and Glidescope Grade View: Grade I Tube size: 7.5 mm Number of attempts: 1 Airway Equipment and Method: Stylet and Video-laryngoscopy Placement Confirmation: ETT inserted through vocal cords under direct vision,  positive ETCO2 and breath sounds checked- equal and bilateral Secured at: 21 cm Tube secured with: Tape Dental Injury: Teeth and Oropharynx as per pre-operative assessment  Comments: Pt is not difficult airway, video laryngoscope chosen by anesthesia provider due to facial trauma and injuries.  Henderson Cloud, CRNA

## 2017-11-14 NOTE — Progress Notes (Signed)
RT present for conscious sedation. Vitals stable throughout procedure. Upon completion, etco2 66. MD is aware. RT will monitor as needed.
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
## 2017-11-14 NOTE — Op Note (Signed)
Operative Note   DATE OF OPERATION: 4.7.19  LOCATION: [facility]-inpatient  SURGICAL DIVISION: Plastic Surgery  PREOPERATIVE DIAGNOSES:  1. Open fracture nasal bones 2. Laceration lower lip 3. Laceration nasal tip  POSTOPERATIVE DIAGNOSES:  same  PROCEDURE:  1. Closed reduction nasal bones with stabilization 2.Simple repair lower lip 5 cm 3. Complex repair nasal tip 6 cm  SURGEON: Andre FellowsBrinda Tamara Kenyon MD MBA  ASSISTANT: none  ANESTHESIA:  General.   EBL: 20 ml  COMPLICATIONS: None immediate.   INDICATIONS FOR PROCEDURE:  The patient, Andre [name], is a 27 y.o. male born on [birth year], is here for repair complex laceration nasal tip, lower lip with underlying nasal bone fracture.   FINDINGS: Stellate laceration with avulsion through columella into bilateral nasal lining. Laceration though lower lateral cartilage left.  DESCRIPTION OF PROCEDURE:  The patient's operative site was confirmed with family in preoperative area. The patient was taken to the operating room. IV antibiotics were given. The patient's operative site was prepped and draped in a sterile fashion. A time out was performed and all information was confirmed to be correct. Local anesthetic infiltrated to perform bilateral infraorbital and supraorbital nerve blocks and across nasal dorsum. Sharp excision of wound margins completed over lower lip and avulsed nasal flap. Simple repair lower lip lacerations completed with 4-0 chromic, total length 5 cm. Using blunt elevator, reduction nasal bones completed. The laceration through nasal dome lower lateral cartilage left completed with 5-0 monocryl. The bilateral intranasal lacerations closed with 4-0 chromic. The avulsed skin flap approximated at columella with 5-0 monocryl . Laceration through left ala rim partial thickness repaired with 5-0 monocryl in dermis. Skin closure completed with interrupted 5-0 plain gut, total length 6 cm. Antibiotic ointment applied to lower lip.  Merocell packing placed in bilateral nostrils. Denver splint applied externally.  The patient was allowed to wake from anesthesia, extubated and taken to the recovery room in satisfactory condition.   SPECIMENS: none  DRAINS: none  Andre FellowsBrinda Theran Vandergrift, MD [facility] [phone], pin [phone]

## 2017-11-14 NOTE — Progress Notes (Signed)
Oral airway removed by pt - pulling O2 mask/gown/leads  off

## 2017-11-14 NOTE — Progress Notes (Signed)
Less agitated - able to be redirected by RN

## 2017-11-14 NOTE — Progress Notes (Signed)
Follow up call to Dr Allie Bossierhris Blackman- okay to do CT of R hip later on today - Dr Lindie SpruceWyatt to be admitting pt- called for orders
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
## 2017-11-14 NOTE — Anesthesia Preprocedure Evaluation (Addendum)
Anesthesia Evaluation  Patient identified by MRN, date of birth, ID band Patient awake    Reviewed: Allergy & Precautions, NPO status , Patient's Chart, lab work & pertinent test results  Airway Mallampati: II  TM Distance: >3 FB Neck ROM: Full    Dental no notable dental hx. (+) Dental Advisory Given   Pulmonary neg pulmonary ROS,    Pulmonary exam normal        Cardiovascular negative cardio ROS Normal cardiovascular exam     Neuro/Psych negative neurological ROS  negative psych ROS   GI/Hepatic negative GI ROS, Neg liver ROS,   Endo/Other  Morbid obesity  Renal/GU negative Renal ROS     Musculoskeletal negative musculoskeletal ROS (+)   Abdominal   Peds  Hematology negative hematology ROS (+)   Anesthesia Other Findings Day of surgery medications reviewed with the patient.  Reproductive/Obstetrics                            Anesthesia Physical Anesthesia Plan  ASA: III and emergent  Anesthesia Plan: General   Post-op Pain Management:    Induction: Intravenous, Rapid sequence and Cricoid pressure planned  PONV Risk Score and Plan: 3 and Ondansetron, Dexamethasone and Diphenhydramine  Airway Management Planned: Oral ETT  Additional Equipment:   Intra-op Plan:   Post-operative Plan: Extubation in OR  Informed Consent: I have reviewed the patients History and Physical, chart, labs and discussed the procedure including the risks, benefits and alternatives for the proposed anesthesia with the patient or authorized representative who has indicated his/her understanding and acceptance.   Dental advisory given and Consent reviewed with POA  Plan Discussed with: Anesthesiologist, Surgeon and CRNA  Anesthesia Plan Comments:       Anesthesia Quick Evaluation
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
## 2017-11-14 NOTE — ED Notes (Signed)
Pt belongings given to pt gf, Erica K.

## 2017-11-14 NOTE — ED Notes (Signed)
Patient taken to CT.

## 2017-11-14 NOTE — Progress Notes (Signed)
Patient ID: Andre [name], male   DOB: December 04, 1990, 27 y.o.   MRN: [id number] The patient has a traumatic right hip dislocation with a femoral head fracture.  Attempts to close reduce this in the emergency room were unsuccessful under sedation.  The ER staff attempted to try propofol but just could not get the patient relaxed enough.  I struggled with several attempts to reduce the hip but was unsuccessful.  He will now be posted for an urgent surgery for this morning to closed versus open reduced the hip and possibly addressing the femoral head fracture in the same setting.  I will attempt to contact Dr. Myrene GalasMichael [name], orthopedic traumatologist, for his expertise and assistance in this difficult case.

## 2017-11-14 NOTE — Progress Notes (Signed)
Chaplain is grateful for the clinical care and empathy of Nurse Perpetue.  Chaplain met the pt.'s family in the Emergency Department waiting area.  It was the desire of the mother's heart just to see [name] face.  Chaplain asked the pt.s nurse and she granted permission with the understanding that the pt.'s mother would need to be escorted in by the chaplain and the visit would need to be brief. Chaplain led mother to pt.'s room in the ED D-36  Mother was able to see patient and was glad.  Chaplain provided Ministry of Presence, Comfort and Prayer.  Mother's name is  Mather [place]  [place], [place] [address]  [phone]  The Pt.'s mother takes care of the Pt.'s two young daughters [place] and [place]. They are 3 & 4.

## 2017-11-15 ENCOUNTER — Encounter (HOSPITAL_COMMUNITY): Payer: Self-pay | Admitting: Orthopaedic Surgery

## 2017-11-15 ENCOUNTER — Inpatient Hospital Stay (HOSPITAL_COMMUNITY): Payer: Managed Care, Other (non HMO)

## 2017-11-15 LAB — BASIC METABOLIC PANEL
ANION GAP: 12 (ref 5–15)
BUN: 6 mg/dL (ref 6–20)
CALCIUM: 8.9 mg/dL (ref 8.9–10.3)
CO2: 23 mmol/L (ref 22–32)
CREATININE: 0.94 mg/dL (ref 0.61–1.24)
Chloride: 104 mmol/L (ref 101–111)
GFR calc non Af Amer: >60 mL/min (ref >60)
GLUCOSE: 94 mg/dL (ref 65–99)
Potassium: 3.4 mmol/L — ABNORMAL LOW (ref 3.5–5.1)
Sodium: 139 mmol/L (ref 135–145)

## 2017-11-15 LAB — CBC
HEMATOCRIT: 38.7 % — AB (ref 39.0–52.0)
HEMOGLOBIN: 14.1 g/dL (ref 13.0–17.0)
MCH: 27.3 pg (ref 26.0–34.0)
MCHC: 36.4 g/dL — AB (ref 30.0–36.0)
MCV: 75 fL — AB (ref 78.0–100.0)
Platelets: 148 10*3/uL — ABNORMAL LOW (ref 150–400)
RBC: 5.16 MIL/uL (ref 4.22–5.81)
RDW: 16.2 % — AB (ref 11.5–15.5)
WBC: 18.4 10*3/uL — ABNORMAL HIGH (ref 4.0–10.5)

## 2017-11-15 MED ORDER — ORAL CARE MOUTH RINSE
15.0000 mL | Freq: Two times a day (BID) | OROMUCOSAL | Status: DC
  Administered 2017-11-15 – 2017-11-17 (×6): 15 mL via OROMUCOSAL

## 2017-11-15 MED ORDER — ALBUTEROL SULFATE (2.5 MG/3ML) 0.083% IN NEBU: 2.5000 mg | INHALATION_SOLUTION | Freq: Four times a day (QID) | RESPIRATORY_TRACT | Status: DC | PRN

## 2017-11-15 MED ORDER — ALBUTEROL SULFATE (2.5 MG/3ML) 0.083% IN NEBU: 2.5000 mg | INHALATION_SOLUTION | Freq: Four times a day (QID) | RESPIRATORY_TRACT | Status: DC
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
## 2017-11-15 NOTE — Progress Notes (Signed)
Plastic Surgery  POD#1 CR nose, repair lip and nasal lacerations  Temp:  [97.5 F (36.4 C)-99.3 F (37.4 C)] 99.2 F (37.3 C) (04/07 2048) Pulse Rate:  [63-101] 71 (04/07 2048) Resp:  [12-28] 16 (04/08 0100) BP: (142-196)/(60-94) 150/79 (04/07 2048) SpO2:  [91 %-100 %] 98 % (04/08 0100)    PE Alert NAD  External splint dry intact, repair columella intact Lip repair intact  [place] in place  A/P [place] packing removed. Counseled should expect bloody drainage nose for 24-48 hours. Try not to blow nose for week . Plan external splint for 5-7 days. If able to discharge prior to this time then will remove in clinic.  Antibiotics extended post op for intranasal packing- this has completed and intranasal packing removed, no further antibiotics needed from Plastics standpoint.  Glenna FellowsBrinda Kwynn Schlotter, MD [facility] Plastic & Reconstructive Surgery [phone], pin [phone]

## 2017-11-15 NOTE — Progress Notes (Signed)
I have reviewed the CT scan and discussed with Drs. Magnus IvanBlackman, Haddix, and other colleagues. We are all in agreement that continuation of nonoperative management of the femoral head fracture dislocation offers the best risk reward ratio.   Reduction is excellent with no significant chondral debris; suggestion of small anterior chondral flap could be feasibly considered for arthroscopic removal but think this would be technically difficult given patient's body habitus.   Will d/w Dr. Aundria Rudogers, as well.  Continue TDWB on the RLE with strict posterior hip precautions.  Myrene GalasMichael Jolana Runkles, MD [facility] [phone] (p)
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
## 2017-11-16 LAB — BASIC METABOLIC PANEL
ANION GAP: 10 (ref 5–15)
BUN: 8 mg/dL (ref 6–20)
CALCIUM: 8.9 mg/dL (ref 8.9–10.3)
CO2: 25 mmol/L (ref 22–32)
Chloride: 103 mmol/L (ref 101–111)
Creatinine, Ser: 1.04 mg/dL (ref 0.61–1.24)
GFR calc non Af Amer: >60 mL/min (ref >60)
Glucose, Bld: 91 mg/dL (ref 65–99)
Potassium: 3.5 mmol/L (ref 3.5–5.1)
Sodium: 138 mmol/L (ref 135–145)

## 2017-11-16 MED ORDER — METHOCARBAMOL 500 MG PO TABS
500.0000 mg | ORAL_TABLET | Freq: Three times a day (TID) | ORAL | Status: DC | PRN
  Administered 2017-11-16 – 2017-11-17 (×3): 500 mg via ORAL
  Filled 2017-11-16 (×3): qty 1

## 2017-11-16 MED ORDER — ALBUTEROL SULFATE (2.5 MG/3ML) 0.083% IN NEBU: 2.5000 mg | INHALATION_SOLUTION | RESPIRATORY_TRACT | Status: DC | PRN

## 2017-11-16 MED ORDER — ALBUTEROL SULFATE HFA 108 (90 BASE) MCG/ACT IN AERS: 2.0000 | INHALATION_SPRAY | Freq: Four times a day (QID) | RESPIRATORY_TRACT | Status: DC | PRN

## 2017-11-16 MED ORDER — DOCUSATE SODIUM 100 MG PO CAPS
100.0000 mg | ORAL_CAPSULE | Freq: Two times a day (BID) | ORAL | Status: DC
  Administered 2017-11-16 – 2017-11-17 (×3): 100 mg via ORAL
  Filled 2017-11-16 (×3): qty 1

## 2017-11-16 MED ORDER — HYDROMORPHONE HCL 1 MG/ML IJ SOLN: 1.0000 mg | INTRAMUSCULAR | Status: DC | PRN

## 2017-11-16 MED ORDER — IPRATROPIUM-ALBUTEROL 0.5-2.5 (3) MG/3ML IN SOLN
3.0000 mL | Freq: Once | RESPIRATORY_TRACT | Status: AC
  Administered 2017-11-16: 3 mL via RESPIRATORY_TRACT
  Filled 2017-11-16 (×2): qty 3

## 2017-11-16 MED ORDER — ACETAMINOPHEN 325 MG PO TABS
650.0000 mg | ORAL_TABLET | Freq: Four times a day (QID) | ORAL | Status: DC | PRN
  Administered 2017-11-16: 650 mg via ORAL
  Filled 2017-11-16: qty 2

## 2017-11-16 MED ORDER — ENOXAPARIN SODIUM 40 MG/0.4ML ~~LOC~~ SOLN
40.0000 mg | SUBCUTANEOUS | Status: DC
  Administered 2017-11-16 – 2017-11-17 (×2): 40 mg via SUBCUTANEOUS
  Filled 2017-11-16 (×2): qty 0.4

## 2017-11-16 MED ORDER — OXYCODONE HCL 5 MG PO TABS
5.0000 mg | ORAL_TABLET | ORAL | Status: DC | PRN
  Administered 2017-11-16 – 2017-11-17 (×3): 10 mg via ORAL
  Filled 2017-11-16 (×5): qty 2
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
## 2017-11-16 NOTE — Progress Notes (Signed)
Mr. Garner NashDaniels and I discussed his desire to proceed with nonoperative measures at this time regarding the right hip.  I think is very reasonable.  I spoke with Dr. Magnus IvanBlackman who will be following him up post hospitalization.  He knows to contact me if there is any issues that should arise that might would warrant a subacute hip arthroscopy.

## 2017-11-16 NOTE — Progress Notes (Signed)
Removed condom catheter. Area clean, dry, and intact. Educated patient on the use of a urinal. Patient verbally understood. Will continue to monitor
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
## 2017-11-16 NOTE — Progress Notes (Signed)
Orthopedic Tech Progress Note Patient Details:  Andre McclintockJaquan [name] 1991/05/10 [id number]  Ortho Devices Type of Ortho Device: Crutches Ortho Device/Splint Interventions: Ordered, Adjustment   Post Interventions Patient Tolerated: Well Instructions Provided: Care of device   Andre [name], Andre [name] 11/16/2017, 3:37 PM
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
## 2017-11-17 LAB — CBC
HCT: 37.9 % — ABNORMAL LOW (ref 39.0–52.0)
Hemoglobin: 13.8 g/dL (ref 13.0–17.0)
MCH: 27.2 pg (ref 26.0–34.0)
MCHC: 36.4 g/dL — ABNORMAL HIGH (ref 30.0–36.0)
MCV: 74.8 fL — AB (ref 78.0–100.0)
PLATELETS: 175 10*3/uL (ref 150–400)
RBC: 5.07 MIL/uL (ref 4.22–5.81)
RDW: 16 % — AB (ref 11.5–15.5)
WBC: 13.1 10*3/uL — AB (ref 4.0–10.5)

## 2017-11-17 LAB — BASIC METABOLIC PANEL
ANION GAP: 11 (ref 5–15)
BUN: 11 mg/dL (ref 6–20)
CALCIUM: 8.6 mg/dL — AB (ref 8.9–10.3)
CO2: 25 mmol/L (ref 22–32)
Chloride: 101 mmol/L (ref 101–111)
Creatinine, Ser: 0.94 mg/dL (ref 0.61–1.24)
GFR calc Af Amer: >60 mL/min (ref >60)
GLUCOSE: 106 mg/dL — AB (ref 65–99)
Potassium: 3.5 mmol/L (ref 3.5–5.1)
Sodium: 137 mmol/L (ref 135–145)

## 2017-11-17 MED ORDER — METHOCARBAMOL 500 MG PO TABS
500.0000 mg | ORAL_TABLET | Freq: Three times a day (TID) | ORAL | 0 refills | Status: AC | PRN
Start: 2017-11-17 — End: ?

## 2017-11-17 MED ORDER — OXYCODONE HCL 5 MG PO TABS: 5.0000 mg | ORAL_TABLET | Freq: Four times a day (QID) | ORAL | 0 refills | Status: DC | PRN

## 2017-11-17 NOTE — Progress Notes (Signed)
Orthopedic Tech Progress Note Patient Details:  Andre McclintockJaquan [name] May 16, 1991 [id number]  Ortho Devices Type of Ortho Device: Crutches Ortho Device/Splint Interventions: Application   Post Interventions Patient Tolerated: Well Instructions Provided: Care of device   [name], Shariah Assad 11/17/2017, 10:18 AM

## 2017-11-17 NOTE — Progress Notes (Signed)
Orthopedic Tech Progress Note Patient Details:  Andre McclintockJaquan Bisson 1991-06-06 [id number]  Patient ID: Andre [name], male   DOB: 1991-06-06, 27 y.o.   MRN: [id number]   [name], [name] 11/17/2017, 10:21 AM Ortho visit and crutch order to be deleted on 4/10 because order was placed on 4/9 and crutches were provided to pt on 4/9
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
## 2017-11-17 NOTE — Progress Notes (Signed)
Patient made statement about driving once discharged from hospital. RN educated patient on not driving while taking pain medication, and how discharge orders/plans would be explained to patient and girlfriend upon discharge, and that information would be re-explained. Patients girlfriend verbally agreed that patient would not be driving once discharged and that she understood the importance. Patient still stated "that's what the cruise control button is for." I again re-educated the patient. Patient did not verbalize understanding. Will continue to educate patient on topic and will pass on to night shift nurse.

## 2017-11-17 NOTE — Progress Notes (Signed)
Andre McclintockJaquan [name] to be D/C'd Home per MD order.  Discussed prescriptions and follow up appointments with the patient. Prescriptions given to patient, medication list explained in detail. Pt verbalized understanding.  Allergies as of 11/17/2017      Reactions   Ibuprofen Hives      Medication List    TAKE these medications   acetaminophen 500 MG tablet Commonly known as:  TYLENOL Take 1,000 mg by mouth every 6 (six) hours as needed for headache (pain).   albuterol 108 (90 Base) MCG/ACT inhaler Commonly known as:  PROVENTIL HFA;VENTOLIN HFA Inhale 2 puffs into the lungs every 6 (six) hours as needed for wheezing or shortness of breath.   methocarbamol 500 MG tablet Commonly known as:  ROBAXIN Take 1 tablet (500 mg total) by mouth every 8 (eight) hours as needed for muscle spasms.   oxyCODONE 5 MG immediate release tablet Commonly known as:  Oxy IR/ROXICODONE Take 1 tablet (5 mg total) by mouth every 6 (six) hours as needed for severe pain.            Durable Medical Equipment  (From admission, onward)        Start     Ordered   11/17/17 1420  For home use only DME Walker rolling  Once    Question:  Patient needs a walker to treat with the following condition  Answer:  Fracture of femoral head (HCC)   11/17/17 1419   11/17/17 0741  For home use only DME 3 n 1  Once     11/17/17 0743   11/17/17 0741  For home use only DME Crutches  Once     11/17/17 0743      Vitals:   11/16/17 2009 11/17/17 0503  BP: 138/64 137/73  Pulse: 72 82  Resp: 18 18  Temp: 98.5 F (36.9 C) 98.7 F (37.1 C)  SpO2: 98% 98%    Skin clean, dry and intact without evidence of skin break down, no evidence of skin tears noted. Nasal packing and splint, clean, dry, and intact. Patient has equipment for home, crutches, 3 in 1, and walker. IV catheter discontinued intact. Site without signs and symptoms of complications. Dressing and pressure applied. Pt denies pain at this time. No complaints  noted.  An After Visit Summary was printed and given to the patient. Prescriptions were called into pharmacy per PA. Patient escorted via WC, and D/C home via private auto.  GrenadaBrittany Jamea Robicheaux RN

## 2017-11-17 NOTE — Discharge Instructions (Signed)
No weight at all on your right hip. Only use crutches or a walker when up.  Keep splint on nose, this will be removed at your follow up appointment with Dr. Leta Baptisthimmappa
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
## 2017-11-18 ENCOUNTER — Encounter (HOSPITAL_BASED_OUTPATIENT_CLINIC_OR_DEPARTMENT_OTHER): Payer: Self-pay | Admitting: Respiratory Therapy

## 2017-11-23 ENCOUNTER — Telehealth (INDEPENDENT_AMBULATORY_CARE_PROVIDER_SITE_OTHER): Payer: Self-pay | Admitting: Orthopaedic Surgery

## 2017-11-23 NOTE — Telephone Encounter (Signed)
[place] [facility] called asking for verbal approval for home health PT 2 week 3. CB # [phone]

## 2017-11-23 NOTE — Telephone Encounter (Signed)
Wrong number was given

## 2017-12-02 ENCOUNTER — Inpatient Hospital Stay (INDEPENDENT_AMBULATORY_CARE_PROVIDER_SITE_OTHER): Payer: 59 | Admitting: Physician Assistant

## 2017-12-08 ENCOUNTER — Ambulatory Visit (INDEPENDENT_AMBULATORY_CARE_PROVIDER_SITE_OTHER): Payer: 59

## 2017-12-08 ENCOUNTER — Ambulatory Visit (INDEPENDENT_AMBULATORY_CARE_PROVIDER_SITE_OTHER): Payer: 59 | Admitting: Physician Assistant

## 2017-12-08 ENCOUNTER — Encounter (INDEPENDENT_AMBULATORY_CARE_PROVIDER_SITE_OTHER): Payer: Self-pay | Admitting: Physician Assistant

## 2017-12-08 DIAGNOSIS — S72001D Fracture of unspecified part of neck of right femur, subsequent encounter for closed fracture with routine healing: Secondary | ICD-10-CM | POA: Diagnosis not present

## 2017-12-08 DIAGNOSIS — S73004A Unspecified dislocation of right hip, initial encounter: Secondary | ICD-10-CM

## 2017-12-08 MED ORDER — OXYCODONE HCL 5 MG PO TABS: 5.0000 mg | ORAL_TABLET | Freq: Four times a day (QID) | ORAL | 0 refills | Status: AC | PRN

## 2017-12-08 NOTE — Progress Notes (Signed)
Office Visit Note   Patient: Andre [name]           Date of Birth: 05-17-1991           MRN: [id number] Visit Date: 12/08/2017              Requested by: No referring provider defined for this encounter. PCP: Patient, No Pcp Per   Assessment & Plan: Visit Diagnoses:  1. Closed fracture dislocation of right hip joint with routine healing, subsequent encounter   2. Hip dislocation, right (HCC)     Plan: We will continue with nonweightbearing right leg until he 6 weeks postop.  Continue home health to work on gait balance and ambulation.  Also advanced him to 50% weightbearing on the right leg as of May 19.  We will see him back in 1 month obtain AP and lateral views of the right hip at that time.  Questions were encouraged and answered at length today.  Refill on his oxygen codon was given.  Follow-Up Instructions: Return in about 1 month (around 01/05/2018) for Radiographs.   Orders:  Orders Placed This Encounter  Procedures  . XR HIP UNILAT W OR W/O PELVIS 2-3 VIEWS RIGHT   Meds ordered this encounter  Medications  . oxyCODONE (OXY IR/ROXICODONE) 5 MG immediate release tablet    Sig: Take 1 tablet (5 mg total) by mouth every 6 (six) hours as needed for severe pain.    Dispense:  30 tablet    Refill:  0      Procedures: No procedures performed   Clinical Data: No additional findings.   Subjective: Chief Complaint  Patient presents with  . Right Hip - Pain    HPI [name] returns today 24 days status post closed reduction right hip dislocation with femoral head fracture.  He has been nonweightbearing.  He has been working with physical therapy at home to work on gait and balance. Review of Systems Denies any chest pain shortness of breath fevers chills.  Objective: Vital Signs: There were no vitals taken for this visit.  Physical Exam  Constitutional: He is oriented to person, place, and time. He appears well-developed and well-nourished. No distress.    Neurological: He is alert and oriented to person, place, and time.  Skin: He is not diaphoretic.    Ortho Exam Dorsiflexion plantarflexion right ankle intact.  Right calf supple nontender. Specialty Comments:  No specialty comments available.  Imaging: Xr Hip Unilat W Or W/o Pelvis 2-3 Views Right  Result Date: 12/08/2017 AP and lateral views right hip: Hip is well located.  Lucency consistent with fracture through the femoral head.  No other bony abnormalities.    PMFS History: Patient Active Problem List   Diagnosis Date Noted  . Hip dislocation, right (HCC) 11/14/2017  . Closed fracture dislocation of right hip joint [facility])    Past Medical History:  Diagnosis Date  . Asthma     History reviewed. No pertinent family history.  Past Surgical History:  Procedure Laterality Date  . CLOSED REDUCTION NASAL FRACTURE N/A 11/14/2017   Procedure: CLOSED REDUCTION NASAL FRACTURE AND CLOSURE OF NASAL LACERATIONS;  Surgeon: Kathryne Hitch, MD;  Location: [facility] OR;  Service: Orthopedics;  Laterality: N/A;  . HIP CLOSED REDUCTION Right 11/14/2017   Procedure: CLOSED REDUCTION HIP;  Surgeon: Kathryne Hitch, MD;  Location: [facility] OR;  Service: Orthopedics;  Laterality: Right;   Social History   Occupational History  . Not on file  Tobacco  Use  . Smoking status: Former Smoker  . Smokeless tobacco: Never Used  Substance and Sexual Activity  . Alcohol use: Yes  . Drug use: No  . Sexual activity: Not on file

## 2018-01-05 ENCOUNTER — Other Ambulatory Visit (INDEPENDENT_AMBULATORY_CARE_PROVIDER_SITE_OTHER): Payer: 59 | Admitting: Physician Assistant

## 2018-02-28 ENCOUNTER — Ambulatory Visit (INDEPENDENT_AMBULATORY_CARE_PROVIDER_SITE_OTHER): Payer: 59 | Admitting: Physician Assistant

## 2018-03-02 ENCOUNTER — Ambulatory Visit (INDEPENDENT_AMBULATORY_CARE_PROVIDER_SITE_OTHER): Payer: 59

## 2018-03-02 ENCOUNTER — Ambulatory Visit (INDEPENDENT_AMBULATORY_CARE_PROVIDER_SITE_OTHER): Payer: 59 | Admitting: Physician Assistant

## 2018-03-02 ENCOUNTER — Encounter (INDEPENDENT_AMBULATORY_CARE_PROVIDER_SITE_OTHER): Payer: Self-pay | Admitting: Physician Assistant

## 2018-03-02 DIAGNOSIS — S72001D Fracture of unspecified part of neck of right femur, subsequent encounter for closed fracture with routine healing: Secondary | ICD-10-CM | POA: Diagnosis not present

## 2018-03-02 NOTE — Progress Notes (Signed)
HPI Mr. Garner NashDaniels returns today now just over 15 weeks status post closed reduction right hip dislocation with femoral head fracture.  He is advanced to 75% weightbearing with one crutch.  He is taking no pain medication at this point time.  He is working on Secretary/administratorstrengthening gait and balance with a Systems analystpersonal trainer.  He has minimal discomfort in the hip at this point in time.  Physical exam: Right hip full range of motion without pain. He is able to fire his hip flexors and do full extension of the knee.  Right calf supple nontender.  Radiographs: 2 views right hip shows the hip to be well located.  Sclerotic activity femoral head consistent with consolidation of the femoral head fracture.  No acute fractures otherwise.  No other bony abnormalities.  Impression: 15 weeks status post closed reduction right hip dislocation with femoral head fracture  Plan: He will continue to work on range of motion strengthening he is can go to full weightbearing on the right hip as tolerated.  We will have him return to work full duties April 04, 2018.  It is felt that he needs this extra time to work on strengthening his leg prior to going back to work.  At his request to fax this note to be sent to Sue Lushndrea at [phone].  He will follow-up with us on an as-needed basis.  I did discuss with him that he could develop avascular necrosis or significant arthritis of this hip.  He is to follow-up with us if he has any increasing pain or mechanical symptoms of the right hip.

## 2018-04-01 ENCOUNTER — Telehealth (INDEPENDENT_AMBULATORY_CARE_PROVIDER_SITE_OTHER): Payer: Self-pay | Admitting: Physician Assistant

## 2018-04-01 NOTE — Telephone Encounter (Signed)
Patient called advised his employer need a more recent  note before he can return to work. Patient said the note he has is from 03/02/18 and his HR is requesting for more information such as what if any restrictions patient will have. Patient said he will be returning back to work 04/04/18.  The employer fax# is [phone] Attn: Morrie Sheldonshley   The number to contact patient is [phone]

## 2018-04-04 ENCOUNTER — Encounter (INDEPENDENT_AMBULATORY_CARE_PROVIDER_SITE_OTHER): Payer: Self-pay

## 2018-04-04 NOTE — Telephone Encounter (Signed)
He can return full duties without restrictions

## 2018-04-04 NOTE — Telephone Encounter (Signed)
Faxed note to provided number and printed one for him to pick up

## 2018-04-04 NOTE — Telephone Encounter (Signed)
Patient called again to request that the date of the RTW date be change to a date in August.  He stated his employer will not accept the note because the date is too far out.  He is wanting to come pick up the note today before 5:00 in order to RTW at 8:30p.m.tonight.  Thank you.

## 2018-04-04 NOTE — Telephone Encounter (Signed)
See below, please advise restrictions
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
